# Patient Record
Sex: Female | Born: 1935 | Hispanic: No | Marital: Single
Health system: Southern US, Community
[De-identification: ages and names within clinical notes are randomized; demographics above are authoritative.]

---

## 2005-01-10 ENCOUNTER — Ambulatory Visit: Payer: Self-pay | Admitting: Internal Medicine

## 2006-02-04 ENCOUNTER — Ambulatory Visit: Payer: Self-pay | Admitting: Internal Medicine

## 2007-02-16 ENCOUNTER — Ambulatory Visit: Payer: Self-pay | Admitting: Internal Medicine

## 2008-02-17 ENCOUNTER — Ambulatory Visit: Payer: Self-pay | Admitting: Internal Medicine

## 2009-02-17 ENCOUNTER — Ambulatory Visit: Payer: Self-pay | Admitting: Internal Medicine

## 2010-01-14 ENCOUNTER — Emergency Department: Payer: Self-pay | Admitting: Internal Medicine

## 2010-01-29 ENCOUNTER — Telehealth (INDEPENDENT_AMBULATORY_CARE_PROVIDER_SITE_OTHER): Payer: Self-pay | Admitting: *Deleted

## 2010-02-19 ENCOUNTER — Ambulatory Visit: Payer: Self-pay | Admitting: Internal Medicine

## 2010-05-10 ENCOUNTER — Ambulatory Visit: Payer: Self-pay | Admitting: Ophthalmology

## 2010-05-21 ENCOUNTER — Ambulatory Visit: Payer: Self-pay | Admitting: Ophthalmology

## 2010-10-30 NOTE — Progress Notes (Signed)
Summary: Pt Cx  Phone Note Other Incoming   Summary of Call: Patty from doctor office called,  the appt. made for 6/22 needs to be cancelled, they didn't want to wait that long, no r/s needed.

## 2011-02-22 ENCOUNTER — Ambulatory Visit: Payer: Self-pay | Admitting: Internal Medicine

## 2011-05-17 ENCOUNTER — Ambulatory Visit: Payer: Self-pay | Admitting: Ophthalmology

## 2011-05-17 DIAGNOSIS — I119 Hypertensive heart disease without heart failure: Secondary | ICD-10-CM

## 2011-05-28 ENCOUNTER — Ambulatory Visit: Payer: Self-pay | Admitting: Ophthalmology

## 2012-02-07 ENCOUNTER — Inpatient Hospital Stay: Payer: Self-pay | Admitting: Surgery

## 2012-02-07 LAB — COMPREHENSIVE METABOLIC PANEL
Albumin: 3.8 g/dL (ref 3.4–5.0)
Anion Gap: 10 (ref 7–16)
BUN: 39 mg/dL — ABNORMAL HIGH (ref 7–18)
Chloride: 105 mmol/L (ref 98–107)
Co2: 23 mmol/L (ref 21–32)
EGFR (African American): 27 — ABNORMAL LOW
Osmolality: 287 (ref 275–301)
Potassium: 3.9 mmol/L (ref 3.5–5.1)
Sodium: 138 mmol/L (ref 136–145)
Total Protein: 7.9 g/dL (ref 6.4–8.2)

## 2012-02-07 LAB — CBC
HCT: 24.3 % — ABNORMAL LOW (ref 35.0–47.0)
HGB: 7.4 g/dL — ABNORMAL LOW (ref 12.0–16.0)
MCH: 20.9 pg — ABNORMAL LOW (ref 26.0–34.0)
MCHC: 30.7 g/dL — ABNORMAL LOW (ref 32.0–36.0)
MCV: 68 fL — ABNORMAL LOW (ref 80–100)
RBC: 3.56 10*6/uL — ABNORMAL LOW (ref 3.80–5.20)
RDW: 17.9 % — ABNORMAL HIGH (ref 11.5–14.5)
WBC: 11.8 10*3/uL — ABNORMAL HIGH (ref 3.6–11.0)

## 2012-02-07 LAB — LACTATE DEHYDROGENASE: LDH: 273 U/L — ABNORMAL HIGH (ref 84–246)

## 2012-02-07 LAB — CK TOTAL AND CKMB (NOT AT ARMC)
CK, Total: 74 U/L (ref 21–215)
CK-MB: 2.3 ng/mL (ref 0.5–3.6)

## 2012-02-07 LAB — IRON AND TIBC
Iron Saturation: 3 %
Unbound Iron-Bind.Cap.: 582 ug/dL

## 2012-02-07 LAB — FERRITIN: Ferritin (ARMC): 5 ng/mL — ABNORMAL LOW (ref 8–388)

## 2012-02-08 LAB — URINALYSIS, COMPLETE
Bilirubin,UR: NEGATIVE
Bilirubin,UR: NEGATIVE
Blood: NEGATIVE
Blood: NEGATIVE
Glucose,UR: NEGATIVE mg/dL (ref 0–75)
Glucose,UR: NEGATIVE mg/dL (ref 0–75)
Ketone: NEGATIVE
Leukocyte Esterase: NEGATIVE
Nitrite: NEGATIVE
Ph: 5 (ref 4.5–8.0)
Ph: 6 (ref 4.5–8.0)
Protein: NEGATIVE
RBC,UR: NONE SEEN /HPF (ref 0–5)
RBC,UR: NONE SEEN /HPF (ref 0–5)
Specific Gravity: 1.005 (ref 1.003–1.030)
Squamous Epithelial: 1
Squamous Epithelial: NONE SEEN
Transitional Epi: 1
WBC UR: 55 /HPF (ref 0–5)

## 2012-02-08 LAB — BASIC METABOLIC PANEL
Anion Gap: 7 (ref 7–16)
BUN: 32 mg/dL — ABNORMAL HIGH (ref 7–18)
Calcium, Total: 9.1 mg/dL (ref 8.5–10.1)
Chloride: 106 mmol/L (ref 98–107)
Co2: 26 mmol/L (ref 21–32)
Creatinine: 1.29 mg/dL (ref 0.60–1.30)
EGFR (African American): 46 — ABNORMAL LOW
Glucose: 90 mg/dL (ref 65–99)

## 2012-02-08 LAB — LIPID PANEL
Cholesterol: 188 mg/dL (ref 0–200)
Triglycerides: 144 mg/dL (ref 0–200)
VLDL Cholesterol, Calc: 29 mg/dL (ref 5–40)

## 2012-02-08 LAB — CK TOTAL AND CKMB (NOT AT ARMC): CK-MB: 1.8 ng/mL (ref 0.5–3.6)

## 2012-02-08 LAB — MAGNESIUM: Magnesium: 2 mg/dL

## 2012-02-08 LAB — CBC WITH DIFFERENTIAL/PLATELET
Bands: 1 %
Basophil #: 0.1 10*3/uL (ref 0.0–0.1)
Eosinophil #: 0.2 10*3/uL (ref 0.0–0.7)
Eosinophil %: 1.9 %
Eosinophil: 2 %
Lymphocyte #: 4.3 10*3/uL — ABNORMAL HIGH (ref 1.0–3.6)
Lymphocytes: 29 %
MCH: 21.4 pg — ABNORMAL LOW (ref 26.0–34.0)
MCHC: 31.7 g/dL — ABNORMAL LOW (ref 32.0–36.0)
MCV: 68 fL — ABNORMAL LOW (ref 80–100)
Neutrophil %: 51.5 %
Platelet: 424 10*3/uL (ref 150–440)
RDW: 17.5 % — ABNORMAL HIGH (ref 11.5–14.5)
Segmented Neutrophils: 59 %

## 2012-02-08 LAB — SGOT (AST)(ARMC): SGOT(AST): 34 U/L (ref 15–37)

## 2012-02-08 LAB — TROPONIN I: Troponin-I: <0.02 ng/mL

## 2012-02-09 LAB — BASIC METABOLIC PANEL
Anion Gap: 9 (ref 7–16)
BUN: 19 mg/dL — ABNORMAL HIGH (ref 7–18)
Creatinine: 1.07 mg/dL (ref 0.60–1.30)
EGFR (Non-African Amer.): 49 — ABNORMAL LOW
Glucose: 86 mg/dL (ref 65–99)
Osmolality: 285 (ref 275–301)
Sodium: 142 mmol/L (ref 136–145)

## 2012-02-09 LAB — CBC WITH DIFFERENTIAL/PLATELET
Basophil %: 1.2 %
Eosinophil #: 0.5 10*3/uL (ref 0.0–0.7)
Eosinophil %: 6.1 %
HCT: 33 % — ABNORMAL LOW (ref 35.0–47.0)
HGB: 10.6 g/dL — ABNORMAL LOW (ref 12.0–16.0)
Lymphocyte #: 3.9 10*3/uL — ABNORMAL HIGH (ref 1.0–3.6)
Lymphocyte %: 43.3 %
MCH: 23.6 pg — ABNORMAL LOW (ref 26.0–34.0)
MCHC: 32 g/dL (ref 32.0–36.0)
MCV: 74 fL — ABNORMAL LOW (ref 80–100)
Neutrophil #: 3.5 10*3/uL (ref 1.4–6.5)
Neutrophil %: 39.2 %
Platelet: 382 10*3/uL (ref 150–440)
RBC: 4.48 10*6/uL (ref 3.80–5.20)
RDW: 22.9 % — ABNORMAL HIGH (ref 11.5–14.5)
WBC: 8.9 10*3/uL (ref 3.6–11.0)

## 2012-02-10 LAB — CBC WITH DIFFERENTIAL/PLATELET
HCT: 34.4 % — ABNORMAL LOW (ref 35.0–47.0)
Lymphocyte %: 38.9 %
MCV: 74 fL — ABNORMAL LOW (ref 80–100)
Monocyte #: 1 x10 3/mm — ABNORMAL HIGH (ref 0.2–0.9)
Monocyte %: 10.9 %
Neutrophil #: 3.8 10*3/uL (ref 1.4–6.5)
Platelet: 379 10*3/uL (ref 150–440)
RDW: 23.3 % — ABNORMAL HIGH (ref 11.5–14.5)
WBC: 9.3 10*3/uL (ref 3.6–11.0)

## 2012-02-11 LAB — CBC WITH DIFFERENTIAL/PLATELET
Basophil #: 0.1 10*3/uL (ref 0.0–0.1)
HCT: 34.1 % — ABNORMAL LOW (ref 35.0–47.0)
HGB: 10.9 g/dL — ABNORMAL LOW (ref 12.0–16.0)
Lymphocyte #: 3.4 10*3/uL (ref 1.0–3.6)
Lymphocyte %: 36.1 %
MCV: 74 fL — ABNORMAL LOW (ref 80–100)
Monocyte #: 0.9 x10 3/mm (ref 0.2–0.9)
Monocyte %: 9.2 %
Neutrophil #: 4.6 10*3/uL (ref 1.4–6.5)
Platelet: 369 10*3/uL (ref 150–440)
RDW: 23.2 % — ABNORMAL HIGH (ref 11.5–14.5)

## 2012-02-12 LAB — CBC WITH DIFFERENTIAL/PLATELET
Basophil #: 0.2 10*3/uL — ABNORMAL HIGH (ref 0.0–0.1)
HCT: 32.4 % — ABNORMAL LOW (ref 35.0–47.0)
HGB: 10.3 g/dL — ABNORMAL LOW (ref 12.0–16.0)
Lymphocyte #: 3.3 10*3/uL (ref 1.0–3.6)
Lymphocyte %: 38.9 %
MCHC: 31.9 g/dL — ABNORMAL LOW (ref 32.0–36.0)
Monocyte #: 0.9 x10 3/mm (ref 0.2–0.9)
Neutrophil %: 42.1 %
Platelet: 355 10*3/uL (ref 150–440)
RBC: 4.37 10*6/uL (ref 3.80–5.20)
RDW: 23 % — ABNORMAL HIGH (ref 11.5–14.5)
WBC: 8.4 10*3/uL (ref 3.6–11.0)

## 2012-02-12 LAB — PATHOLOGY REPORT

## 2012-02-13 LAB — BASIC METABOLIC PANEL
Anion Gap: 7 (ref 7–16)
Calcium, Total: 8.1 mg/dL — ABNORMAL LOW (ref 8.5–10.1)
Creatinine: 0.91 mg/dL (ref 0.60–1.30)
EGFR (African American): >60
Glucose: 123 mg/dL — ABNORMAL HIGH (ref 65–99)
Osmolality: 274 (ref 275–301)
Potassium: 3.9 mmol/L (ref 3.5–5.1)
Sodium: 137 mmol/L (ref 136–145)

## 2012-02-13 LAB — CBC WITH DIFFERENTIAL/PLATELET
Eosinophil #: 0 10*3/uL (ref 0.0–0.7)
Eosinophil %: 0.2 %
HCT: 31.3 % — ABNORMAL LOW (ref 35.0–47.0)
HGB: 9.5 g/dL — ABNORMAL LOW (ref 12.0–16.0)
Lymphocyte #: 1.5 10*3/uL (ref 1.0–3.6)
MCHC: 30.5 g/dL — ABNORMAL LOW (ref 32.0–36.0)
Monocyte #: 1 x10 3/mm — ABNORMAL HIGH (ref 0.2–0.9)
Monocyte %: 6.9 %
Neutrophil #: 11.9 10*3/uL — ABNORMAL HIGH (ref 1.4–6.5)
Neutrophil %: 82 %
WBC: 14.4 10*3/uL — ABNORMAL HIGH (ref 3.6–11.0)

## 2012-02-14 LAB — CBC WITH DIFFERENTIAL/PLATELET
Basophil #: 0.1 10*3/uL (ref 0.0–0.1)
Basophil %: 0.6 %
Eosinophil #: 0.4 10*3/uL (ref 0.0–0.7)
Eosinophil %: 3 %
HGB: 9.2 g/dL — ABNORMAL LOW (ref 12.0–16.0)
Monocyte #: 1.2 x10 3/mm — ABNORMAL HIGH (ref 0.2–0.9)
Monocyte %: 9.5 %
WBC: 12.6 10*3/uL — ABNORMAL HIGH (ref 3.6–11.0)

## 2012-02-14 LAB — BASIC METABOLIC PANEL
BUN: 6 mg/dL — ABNORMAL LOW (ref 7–18)
Chloride: 105 mmol/L (ref 98–107)
EGFR (African American): >60
Glucose: 108 mg/dL — ABNORMAL HIGH (ref 65–99)
Osmolality: 272 (ref 275–301)
Potassium: 4 mmol/L (ref 3.5–5.1)
Sodium: 137 mmol/L (ref 136–145)

## 2012-02-18 LAB — BASIC METABOLIC PANEL
Anion Gap: 8 (ref 7–16)
BUN: 11 mg/dL (ref 7–18)
Calcium, Total: 8.6 mg/dL (ref 8.5–10.1)
Chloride: 106 mmol/L (ref 98–107)
Creatinine: 1.14 mg/dL (ref 0.60–1.30)
EGFR (African American): 53 — ABNORMAL LOW
EGFR (Non-African Amer.): 46 — ABNORMAL LOW
Osmolality: 277 (ref 275–301)
Potassium: 4.1 mmol/L (ref 3.5–5.1)

## 2012-02-18 LAB — CBC WITH DIFFERENTIAL/PLATELET
Eosinophil #: 0.3 10*3/uL (ref 0.0–0.7)
Eosinophil %: 2.3 %
HCT: 31.1 % — ABNORMAL LOW (ref 35.0–47.0)
Lymphocyte %: 17.1 %
MCH: 23.9 pg — ABNORMAL LOW (ref 26.0–34.0)
Monocyte #: 1.2 x10 3/mm — ABNORMAL HIGH (ref 0.2–0.9)
Neutrophil #: 8.5 10*3/uL — ABNORMAL HIGH (ref 1.4–6.5)
Neutrophil %: 70.2 %
Platelet: 445 10*3/uL — ABNORMAL HIGH (ref 150–440)
RBC: 4.23 10*6/uL (ref 3.80–5.20)
WBC: 12.1 10*3/uL — ABNORMAL HIGH (ref 3.6–11.0)

## 2012-02-22 LAB — CBC WITH DIFFERENTIAL/PLATELET
Basophil #: 0.1 10*3/uL (ref 0.0–0.1)
Basophil %: 1 %
Eosinophil #: 0.4 10*3/uL (ref 0.0–0.7)
HCT: 33.7 % — ABNORMAL LOW (ref 35.0–47.0)
HGB: 10.3 g/dL — ABNORMAL LOW (ref 12.0–16.0)
Lymphocyte #: 3.4 10*3/uL (ref 1.0–3.6)
Lymphocyte %: 26.3 %
MCV: 75 fL — ABNORMAL LOW (ref 80–100)
Monocyte #: 1.4 x10 3/mm — ABNORMAL HIGH (ref 0.2–0.9)
Neutrophil #: 7.6 10*3/uL — ABNORMAL HIGH (ref 1.4–6.5)
Platelet: 580 10*3/uL — ABNORMAL HIGH (ref 150–440)
RBC: 4.48 10*6/uL (ref 3.80–5.20)

## 2012-02-22 LAB — BASIC METABOLIC PANEL
BUN: 8 mg/dL (ref 7–18)
Calcium, Total: 9.1 mg/dL (ref 8.5–10.1)
Chloride: 103 mmol/L (ref 98–107)
Creatinine: 1.05 mg/dL (ref 0.60–1.30)
Osmolality: 276 (ref 275–301)
Potassium: 3.8 mmol/L (ref 3.5–5.1)
Sodium: 139 mmol/L (ref 136–145)

## 2012-02-23 LAB — BASIC METABOLIC PANEL
Anion Gap: 10 (ref 7–16)
BUN: 7 mg/dL (ref 7–18)
Calcium, Total: 8.6 mg/dL (ref 8.5–10.1)
Chloride: 106 mmol/L (ref 98–107)
Creatinine: 1.17 mg/dL (ref 0.60–1.30)
EGFR (African American): 51 — ABNORMAL LOW
EGFR (Non-African Amer.): 44 — ABNORMAL LOW
Glucose: 92 mg/dL (ref 65–99)
Osmolality: 275 (ref 275–301)
Potassium: 4.1 mmol/L (ref 3.5–5.1)
Sodium: 139 mmol/L (ref 136–145)

## 2012-02-23 LAB — CBC WITH DIFFERENTIAL/PLATELET
Basophil #: 0.1 10*3/uL (ref 0.0–0.1)
Eosinophil #: 0.6 10*3/uL (ref 0.0–0.7)
HCT: 32.2 % — ABNORMAL LOW (ref 35.0–47.0)
Lymphocyte #: 2.7 10*3/uL (ref 1.0–3.6)
Lymphocyte %: 21.4 %
MCH: 23.8 pg — ABNORMAL LOW (ref 26.0–34.0)
Monocyte #: 1.2 x10 3/mm — ABNORMAL HIGH (ref 0.2–0.9)
Monocyte %: 9.5 %
Neutrophil #: 8.2 10*3/uL — ABNORMAL HIGH (ref 1.4–6.5)
Platelet: 569 10*3/uL — ABNORMAL HIGH (ref 150–440)
RBC: 4.27 10*6/uL (ref 3.80–5.20)
WBC: 12.8 10*3/uL — ABNORMAL HIGH (ref 3.6–11.0)

## 2012-02-26 LAB — BASIC METABOLIC PANEL
Chloride: 107 mmol/L (ref 98–107)
Co2: 26 mmol/L (ref 21–32)
Creatinine: 1.1 mg/dL (ref 0.60–1.30)
EGFR (Non-African Amer.): 48 — ABNORMAL LOW
Osmolality: 281 (ref 275–301)
Sodium: 142 mmol/L (ref 136–145)

## 2012-02-26 LAB — CBC WITH DIFFERENTIAL/PLATELET
Basophil %: 1 %
Eosinophil #: 0.5 10*3/uL (ref 0.0–0.7)
Eosinophil %: 4.6 %
HCT: 30.1 % — ABNORMAL LOW (ref 35.0–47.0)
MCH: 24 pg — ABNORMAL LOW (ref 26.0–34.0)
MCHC: 31.7 g/dL — ABNORMAL LOW (ref 32.0–36.0)
MCV: 76 fL — ABNORMAL LOW (ref 80–100)
Monocyte #: 1.1 x10 3/mm — ABNORMAL HIGH (ref 0.2–0.9)
Monocyte %: 10.1 %
Neutrophil #: 6.8 10*3/uL — ABNORMAL HIGH (ref 1.4–6.5)
Neutrophil %: 62.8 %
Platelet: 554 10*3/uL — ABNORMAL HIGH (ref 150–440)
WBC: 10.8 10*3/uL (ref 3.6–11.0)

## 2012-03-24 ENCOUNTER — Ambulatory Visit: Payer: Self-pay | Admitting: Internal Medicine

## 2012-04-06 ENCOUNTER — Ambulatory Visit: Payer: Self-pay | Admitting: Oncology

## 2012-04-20 ENCOUNTER — Ambulatory Visit: Payer: Self-pay | Admitting: Oncology

## 2012-04-30 ENCOUNTER — Ambulatory Visit: Payer: Self-pay | Admitting: Oncology

## 2012-05-30 ENCOUNTER — Emergency Department: Payer: Self-pay | Admitting: Emergency Medicine

## 2012-12-29 IMAGING — CR DG ABDOMEN 2V
1 series · 2 of 2 positions shown · non-contrast
Comparison: none

REASON FOR EXAM: Ileus
COMMENTS:

[Series 1: erect pa · 0.17mm/px · 2 of 2 slices shown]
[im 1/2]
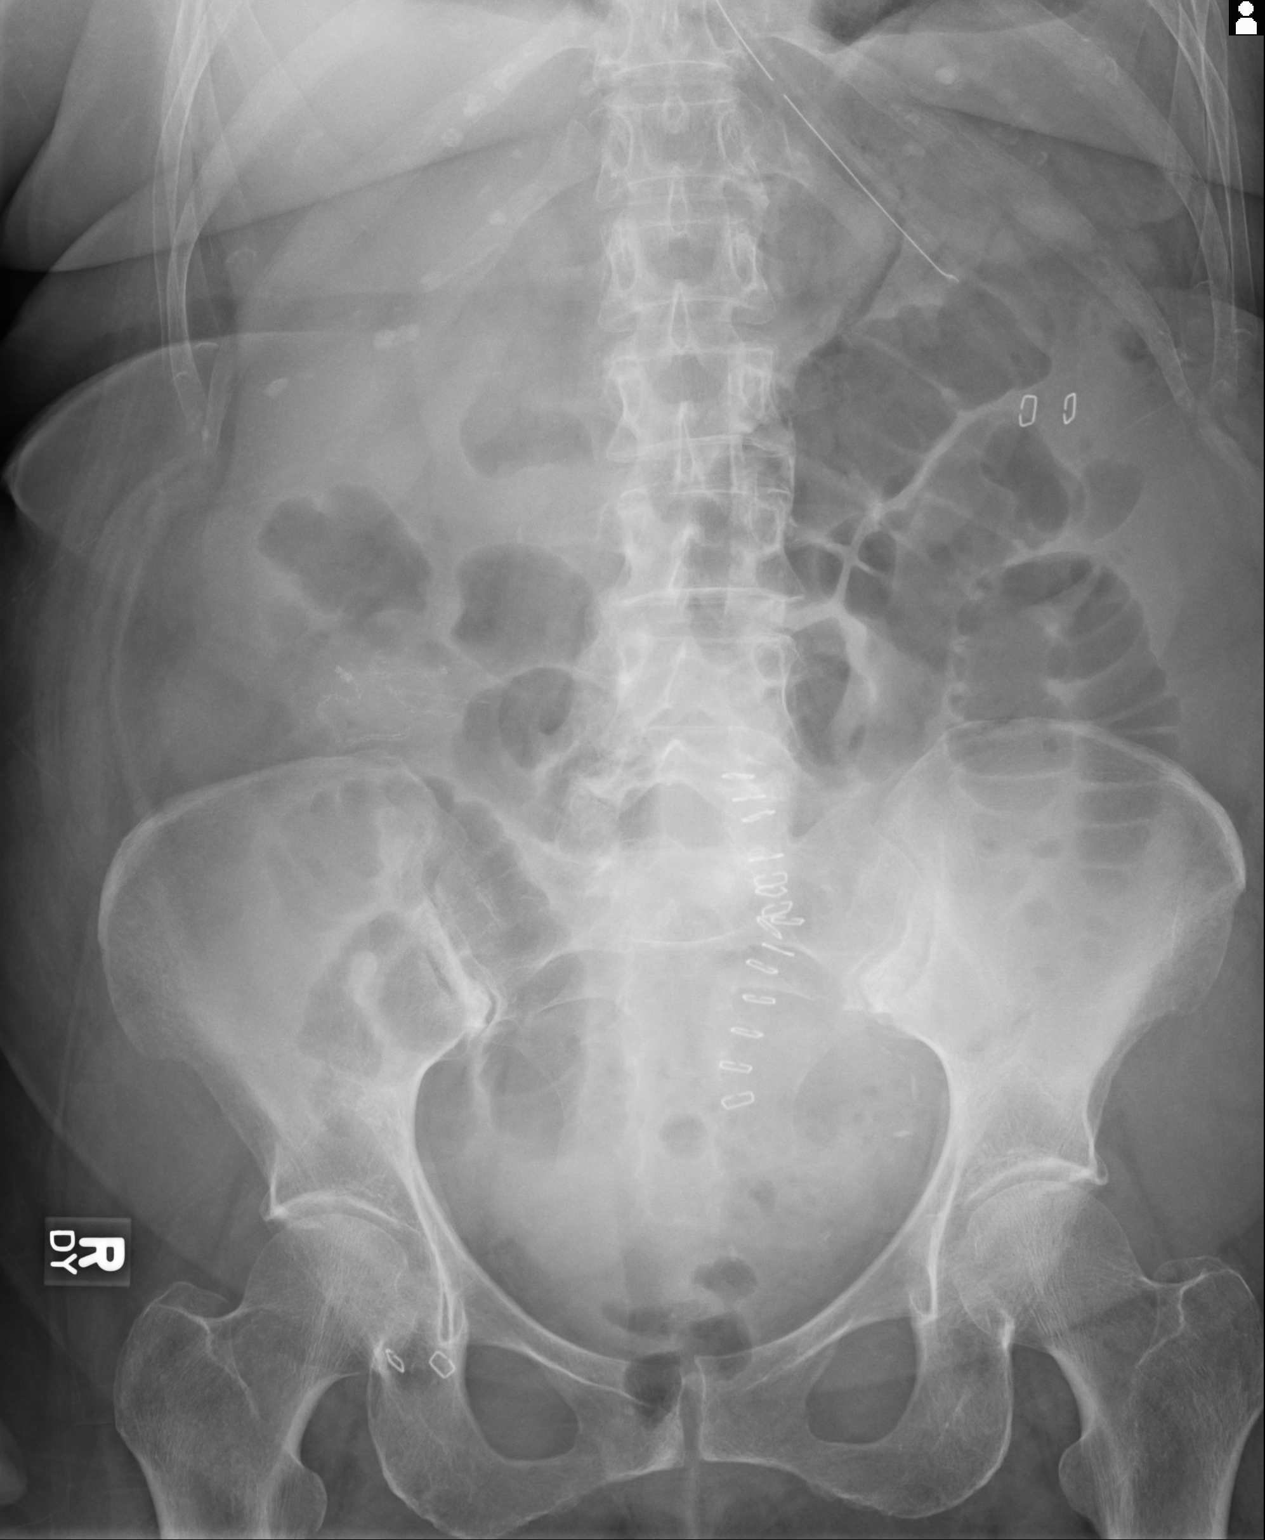
[im 2/2]
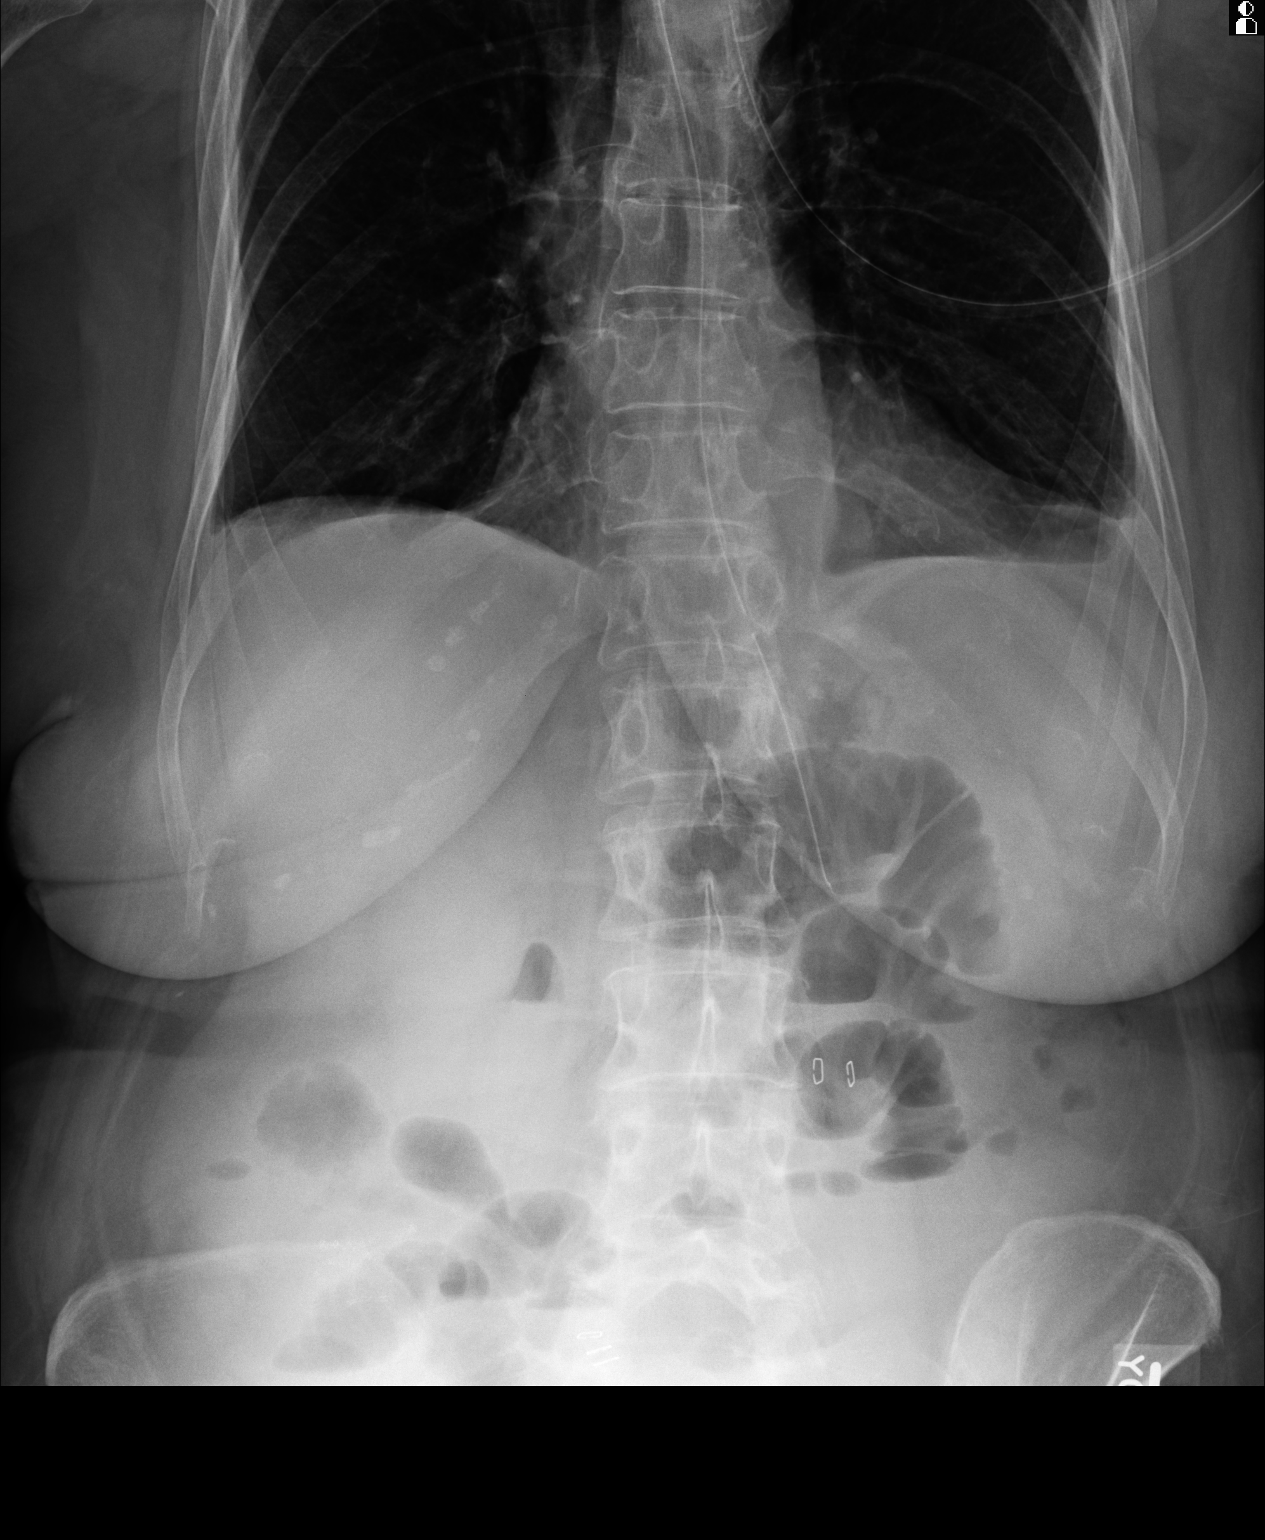

[2 of 2 positions shown; findings below may reference images not displayed]

PROCEDURE:     DXR - DXR ABDOMEN 2 V FLAT AND ERECT  - February 22, 2012  [DATE]

RESULT:     This study was compared to previous study dated 02/21/2012.

Air-filled loops of small bowel are once again appreciated. There does
appear to be small foci of air within loops of distal large bowel. When
compared to the previous study the distended small bowel loops have
decreased in conspicuity. There also appears to be persistent air-fluid
levels. An NG tube has been placed in the interim and these findings like
reflect a component of nasogastric decompression. The small amount of air
within the distal portion of large bowel also likely reflects a component of
resolution of an ileus or partial small bowel obstruction.  Continued
surveillance evaluation is recommended to assure resolution and/or
progression.
IMPRESSION: 1. Findings which appear to demonstrates slight improvement of the patient's
ileus versus partial or early small bowel obstruction a component of which
as described above is likely due to nasogastric tube decompression though
there does appear to be interval development of a small amount of distal
bowel gas suggesting a component of peristalsis in the bowel and possible
component of resolution. Surveillance evaluation is recommended.

## 2013-01-01 IMAGING — CT CT ABD-PELV W/ CM
1 of 3 series · 13 of 32 positions shown, 18 images · non-contrast
Comparison: none

REASON FOR EXAM: (1) SBO; (2) SBO
COMMENTS:

PROCEDURE:     CT  - CT ABDOMEN / PELVIS  W  - February 25, 2012 [DATE]
RESULT:
TECHNIQUE: Helical 3 mm sections were obtained from the lung bases through
the pubic symphysis status post intravenous administration of 85 ml of
Bsovue-KZK and oral contrast.

[Series 2: soft tissue · axial · 0.62mm/px · z∈[-478,-102]mm · 13 of 139 slices shown, 18 images]
[im 7/139  soft-tissue]
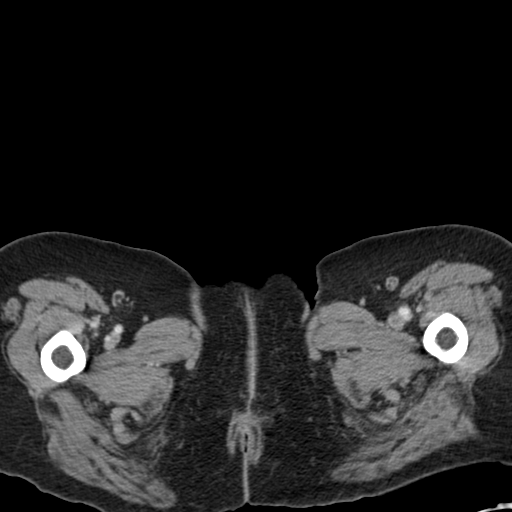
[im 7/139  bone]
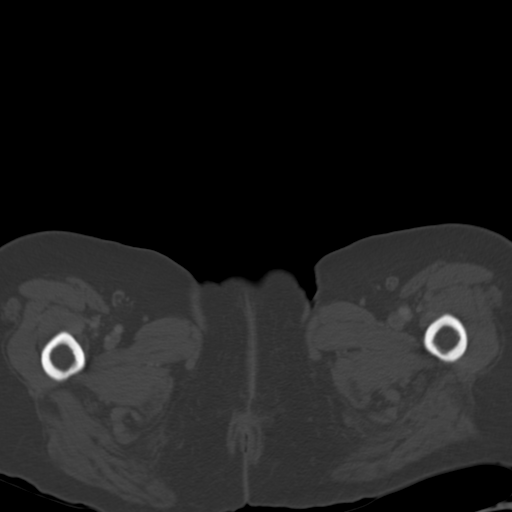
[im 21/139  soft-tissue]
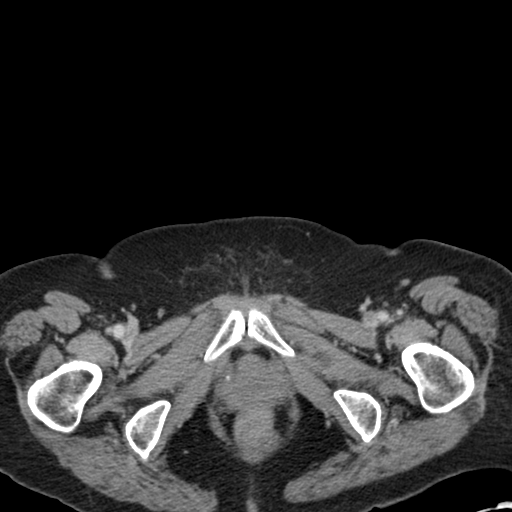
[im 28/139  soft-tissue]
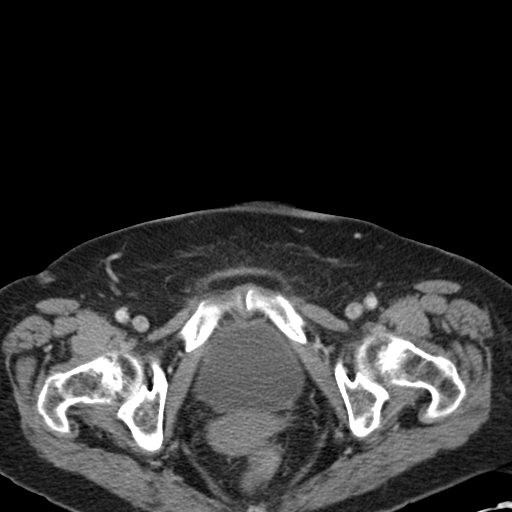
[im 42/139  soft-tissue]
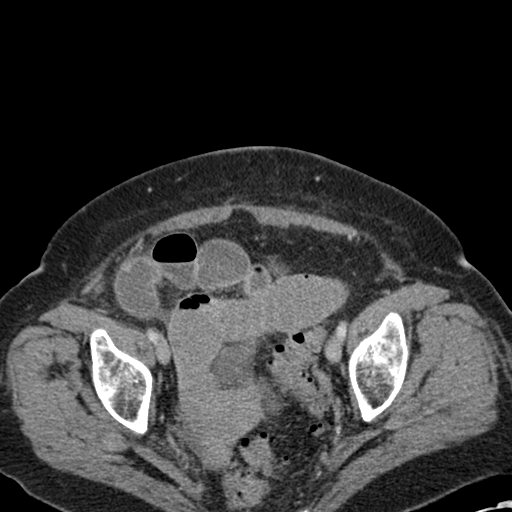
[im 56/139  soft-tissue]
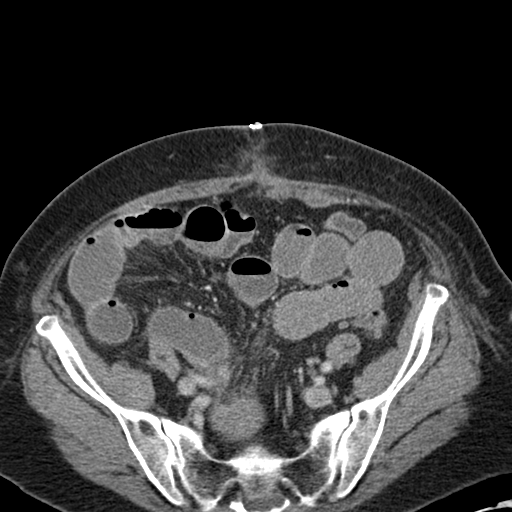
[im 63/139  soft-tissue]
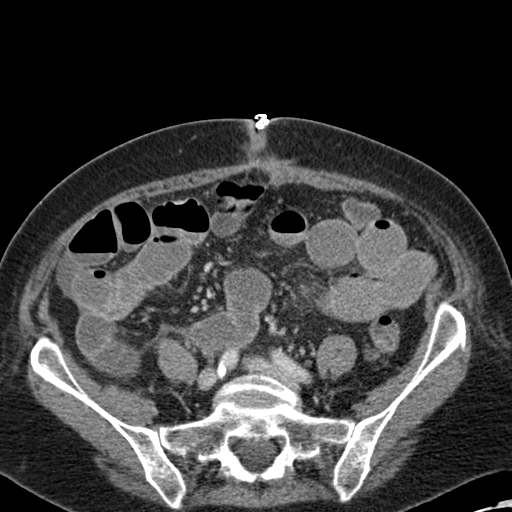
[im 76/139  soft-tissue]
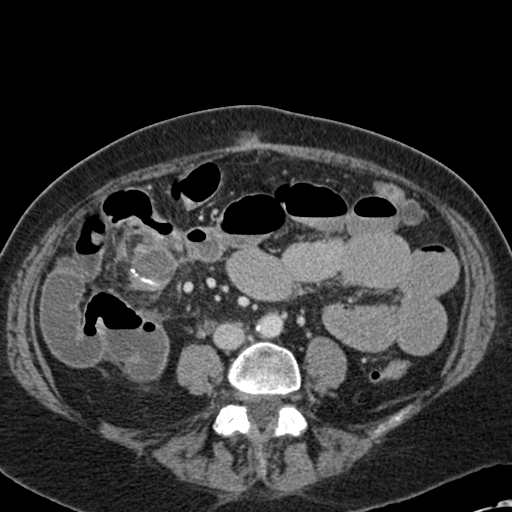
[im 83/139  soft-tissue]
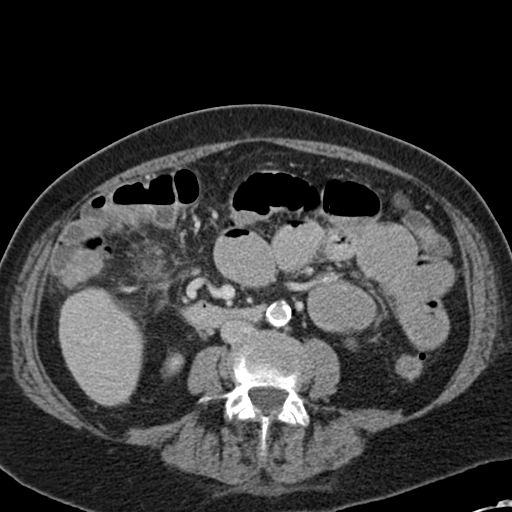
[im 97/139  soft-tissue]
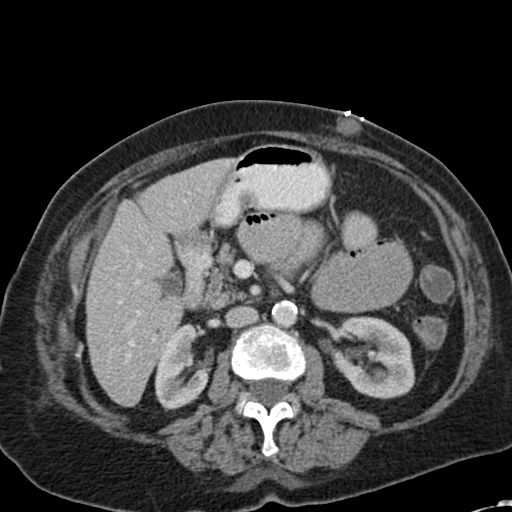
[im 97/139  bone]
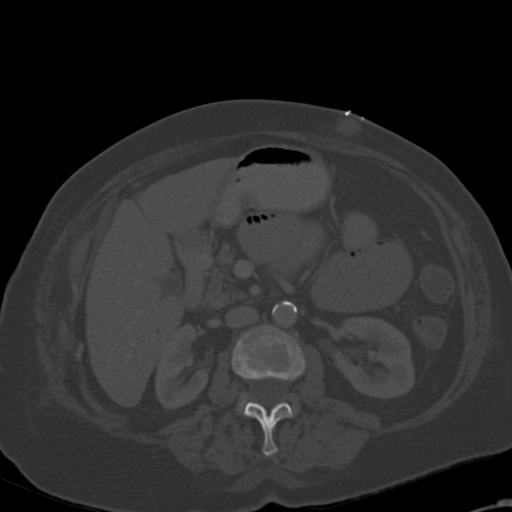
[im 111/139  soft-tissue]
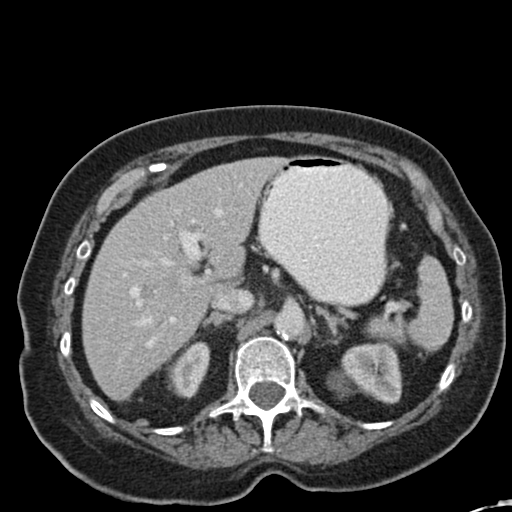
[im 111/139  lung]
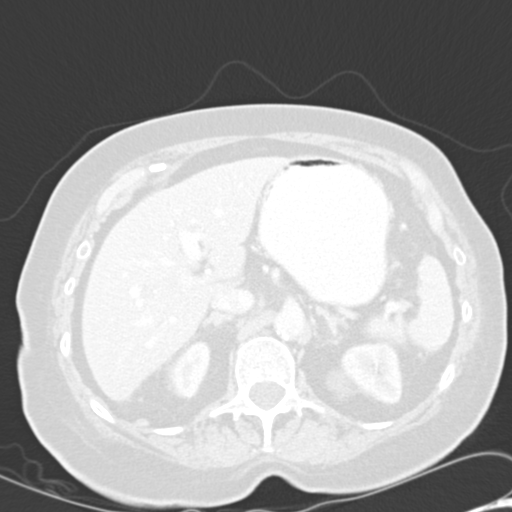
[im 118/139  soft-tissue]
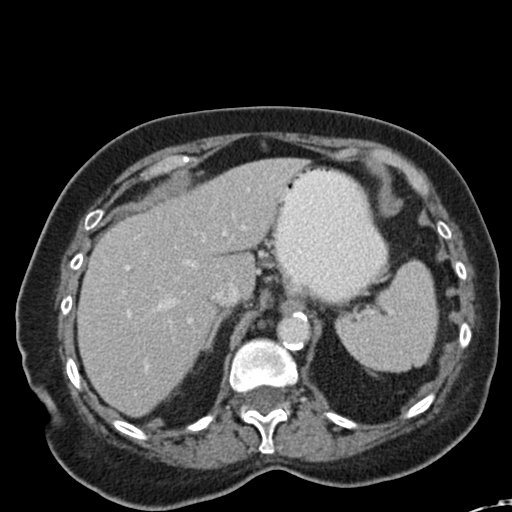
[im 118/139  lung]
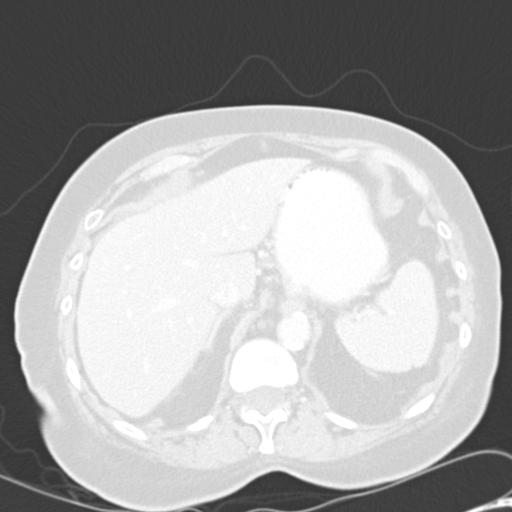
[im 125/139  lung]
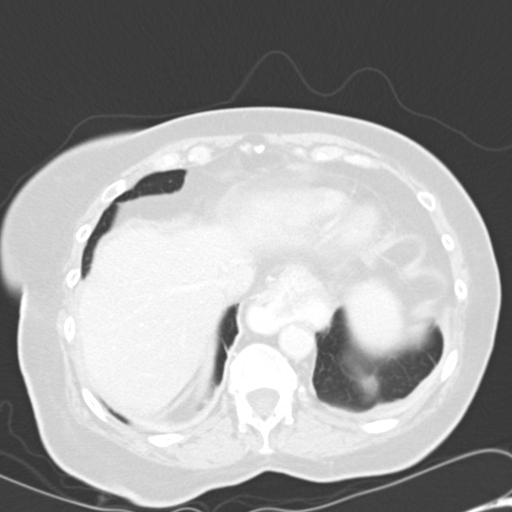
[im 132/139  soft-tissue]
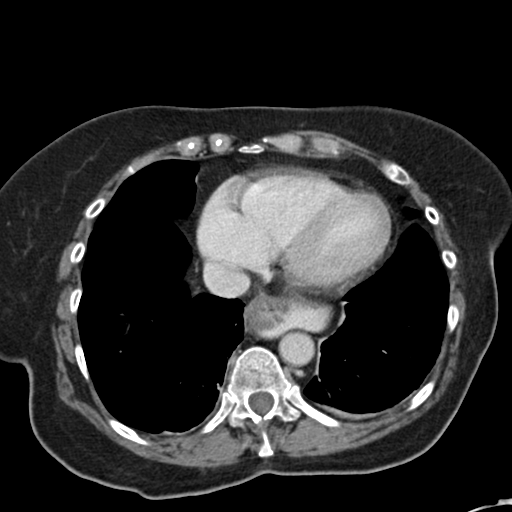
[im 132/139  lung]
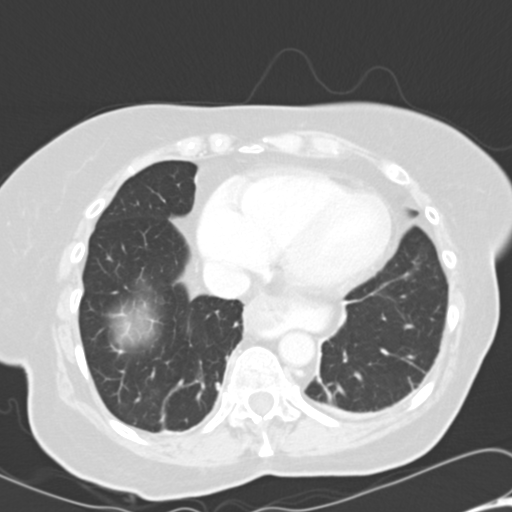

[13 of 32 positions shown; findings below may reference images not displayed]

FINDINGS: Evaluation of the lung bases demonstrates trace pleural
thickening versus trace effusions. A moderate size hiatal hernia is
identified.

The liver, spleen, adrenals, pancreas, and right kidney are unremarkable. A
lobulated, low attenuating, nonenhancing focus is identified along the
posterior midpole region of the left kidney measuring approximately 2.64 x
2.92 cm. This area demonstrates Hounsfield units of 17 and 23 on immediate
and delayed imaging. There does not appear to be appreciable enhancement
though the Hounsfield units are not consistent with simple fluid. This
finding likely represents a proteinaceous or possibly partially hemorrhagic
cyst. The area has a lobulated component and two adjacent cysts versus one
single lobulated cyst are of diagnostic consideration. There is no evidence
of hydronephrosis, hydroureter or nephrolithiasis.

Multiple dilated loops of small bowel are appreciated containing dilute
contrast. Loops are appreciated within the abdomen and pelvis. There appears
to be a transition point within the right lower quadrant in a region of
prior surgical intervention. The patient has a reported history of prior
bowel resection and this finding has the appearance of a region of
anastomosis. This area communicates with the colon which proximally
demonstrates partially air-filled loops and distally is decompressed. No
drainable loculated fluid collections are identified. There is stranding
within the surrounding mesenteric fat which may represent post surgical
change. Inflammatory changes, possibly infectious cannot be excluded. A
trace amount of fluid is appreciated within this region.

Severe diverticulosis is appreciated within the sigmoid colon without
evidence of diverticulitis. There is no evidence of an abdominal aortic
aneurysm. The celiac, SMA, IMA, portal vein, and SMV are opacified.
IMPRESSION: 1.  Findings consistent with a small bowel obstruction. The transition point
appears to be at the level of the patient's colonic small bowel anastomosis.
No drainable loculated fluid collections are identified though there is
stranding within the surrounding mesenteric fat which may represent
inflammatory changes or post surgical changes. Surveillance evaluation is
recommended as well as clinical correlation.
2.  Likely cyst involving the left kidney. This finding too is indeterminate
and surveillance is recommended.
3.  Hiatal hernia.
4.  Diverticulosis within the sigmoid colon without evidence of
diverticulitis.

[REDACTED]

## 2013-03-25 ENCOUNTER — Ambulatory Visit: Payer: Self-pay | Admitting: Internal Medicine

## 2013-03-30 ENCOUNTER — Ambulatory Visit: Payer: Self-pay | Admitting: Oncology

## 2013-04-05 ENCOUNTER — Ambulatory Visit: Payer: Self-pay | Admitting: Oncology

## 2013-04-21 ENCOUNTER — Ambulatory Visit: Payer: Self-pay | Admitting: Unknown Physician Specialty

## 2013-04-30 ENCOUNTER — Ambulatory Visit: Payer: Self-pay | Admitting: Oncology

## 2014-04-11 ENCOUNTER — Ambulatory Visit: Payer: Self-pay | Admitting: Internal Medicine

## 2015-01-22 NOTE — Discharge Summary (Signed)
PATIENT NAME:  Grace English, Grace English MR#:  [id number] DATE OF BIRTH:  1930/08/26  DATE OF ADMISSION:  02/07/2012 DATE OF DISCHARGE:  02/28/2012  BRIEF HISTORY: Ms. Elsworth Sohonn Belsky is a 81 year old woman admitted to the hospital on 05/10 to the internal medicine service through the Emergency Room. She had a recent episode of syncope and transient atrial fibrillation and developed two recent syncopal episodes. She had presenting blood pressure in the low 90s. Hemoglobin was 7.4. Work-up was undertaken. Gastroenterology consultation was obtained. Upper gastrointestinal endoscopy was performed on the thirteenth which did not demonstrate any significant abnormalities. She had been transfused 2 units of blood. Colonoscopy was performed on the fourteenth, which demonstrated a sessile, ulcerating, nonobstructive mass in the mid ascending colon, which felt to likely be responsible for her ongoing anemia. The surgical service was consulted and we saw her that afternoon. We elected to proceed with surgical intervention. She was taken to surgery on the afternoon of 02/12/2012 where she underwent a laparoscopic-assisted right colectomy. Postoperative course was initially unremarkable. She did well and was tentatively planned for discharge on 05/24. However, that afternoon she began to have significant nausea and vomiting and obviously had a pronounced ileus. Nasogastric suction was reinstituted. Over the next several days her bowel was decompressed. She began to regain bowel function by the 29th and advanced to a liquid diet by the 30th and discharged home with a soft diet by the 31st. Postoperative evaluation did not reveal any significant post procedural problems including a postoperative CT scan. She was given Vicodin for pain.   DISCHARGE MEDICATIONS:  1. Biotin 1,000 mcg p.o. daily.  2. Calcium carbonate 250 mg, 3 tablets once a day.  3. Hyzaar 100 mg/25 mg once a day.  4. Lovastatin 20 mg once a day.  5. Niacin 500 mg once  a day.  6. Phenergan 25 mg every four hours p.r.n.  7. Synthroid 50 mcg p.o. daily.  8. Verapamil 180 mg p.o. daily. 9. Vitamin D 2000 international units p.o. daily.   FINAL DISCHARGE DIAGNOSIS: Invasive adenocarcinoma of the colon, initial lesion T1.   SURGERY:  Laparoscopically-assisted right colectomy.   ____________________________ Carmie Endalph L. Ely III, MD rle:bjt D: 03/08/2012 07:14:37 ET T: 03/09/2012 13:16:21 ET JOB#: [id number]  cc: Carmie Endalph L. Ely III, MD, <Dictator> Marya AmslerMarshall W. Dareen PianoAnderson, MD Scot Junobert T. Elliott, MD Quentin OreALPH L ELY MD ELECTRONICALLY SIGNED 03/10/2012 8:38

## 2015-01-22 NOTE — Consult Note (Signed)
PATIENT NAME:  Grace English, Grace English MR#:  [id number] DATE OF BIRTH:  02-Jun-1930  DATE OF CONSULTATION:  02/11/2012  REFERRING PHYSICIAN:   CONSULTING PHYSICIAN:  Carmie Endalph L. Ely III, MD  PRIMARY CARE PHYSICIAN: Dr. Einar CrowMarshall Anderson  GASTROENTEROLOGIST: Dr. Lynnae Prudeobert Elliott   REASON FOR CONSULTATION: Possible colon carcinoma.   BRIEF HISTORY: Grace English is a 81 year old woman presenting to the Emergency Room with a syncopal episode and anemia. She has multiple medical problems, including multiple episodes of syncope with transient atrial fibrillation. Her symptoms began approximately a year and a half ago. Work-up has demonstrated hemoglobin of 7.4. She has no evidence of any cardiac disability at the present time. There was no sign of any atrial fibrillation during her work-up. She had a colonoscopy in 2005. She had multiple polyps at that time and multiple diverticula. With her low hemoglobin she underwent an upper gastrointestinal endoscopy yesterday which was unremarkable and colonoscopy today which demonstrated a 4 cm right-sided lesion which was likely a cancer. Biopsies were taken. The lesion was felt to be in need of resection. The surgical service was consulted.   PAST MEDICAL HISTORY: 1. Hypertension. 2. Hyperlipidemia. 3. Followed by Dr. Einar CrowMarshall Anderson and Dr. Harold HedgeKenneth Fath.  4. History of possible mitral valve prolapse. 5. Did have a previous episode of gastric ulceration.   PAST SURGICAL HISTORY:  1. Tonsillectomy. 2. Breast biopsy. 3. Appendectomy.   PHYSICAL EXAMINATION:  GENERAL: She is an alert, pleasant woman in no significant distress.   VITAL SIGNS: Her vital signs are stable and she is afebrile.   HEENT: No scleral icterus. She has equally round pupils and no scleral icterus.   NECK: Supple without adenopathy. Trachea is midline. I cannot palpate her thyroid gland.   PULMONARY: No wheezes or adventitious sounds. She has normal pulmonary excursion.   CARDIAC: No murmurs  or gallops to my ear and she does not appear to have an irregular rhythm.   ABDOMEN: Soft with no abdominal tenderness. She has no point tenderness or rebound. No guarding. She has a small hernia left of midline which may come from the umbilicus initially. She is no inguinal hernias.   EXTREMITIES: She has no lower extremity findings with normal range of motion and no evidence of any deformity.   PSYCHIATRIC: Normal orientation and normal affect.   ASSESSMENT AND RECOMMENDATIONS: This woman appears to present with a lesion in her right colon. This lesion is likely responsible for her anemia. It is likely malignant. In any event it will require resection. She has had her prep for her colonoscopy and we will continue that prep with clear liquids tonight and plan for a laparoscopic-assisted resection tomorrow. This plan has been outlined to her in detail and she is in agreement. Risks, benefits, and options have been outlined and accepted. Her daughter-in-law is a Advertising copy[employer] nurse and is aware of the implications of surgery and the possibility of complication and colostomy.   ____________________________ Carmie Endalph L. Ely III, MD rle:cms D: 02/11/2012 18:55:46 ET T: 02/12/2012 07:34:29 ET JOB#: [id number]  cc: Carmie Endalph L. Ely III, MD, <Dictator> Darlin PriestlyKenneth A. Lady GaryFath, MD Scot Junobert T. Elliott, MD Marya AmslerMarshall W. Dareen PianoAnderson, MD  Quentin OreALPH L ELY MD ELECTRONICALLY SIGNED 02/12/2012 16:40

## 2015-01-22 NOTE — Consult Note (Signed)
Brief Consult Note: Diagnosis: Iron deficiency anemia.  Symptomatic with associated syncopal episodes.  History of Atrial fibrillation, hypothryoidism, Hypertension and gastric ulcer 1983. Personal history of colonic polyps as well as family history of colonic polyps involving her mother.   Consult note dictated.   Recommend to proceed with surgery or procedure.   Discussed with Attending MD.   Comments: Patient's presentation discussed with Dr. Lynnae Prudeobert Elliott.  Following recommendations made.  In agrement with blood transfusion.  Recommend serial monitoring of H/H.  PPI therapy.  Electronic Signatures: Rodman KeyHarrison, Lorynn Moeser S (NP)  (Signed 11-May-13 11:44)  Authored: Brief Consult Note   Last Updated: 11-May-13 11:44 by Rodman KeyHarrison, Camia Dipinto S (NP)

## 2015-01-22 NOTE — Consult Note (Signed)
PATIENT NAME:  Grace English, Orianna E MR#:  [id number] DATE OF BIRTH:  May 13, 1930  DATE OF CONSULTATION:  02/08/2012  REFERRING PHYSICIAN:   CONSULTING PHYSICIAN:  Rodman Keyawn S. Harrison, NP  ADDENDUM:  CURRENT MEDICATIONS: The patient is supposed to be taking at home: 1. Niacin 50 mg 1 tablet a day.  2. Lovastatin 20 mg 1 tablet daily.  3. Synthroid 50 mcg once a day.  4. Losartan/hydrochlorothiazide 1 tablet daily.  5. Biotin 1000 mcg 1 tablet daily.  6. Vitamin D 2000 international units, 3 capsules daily.  7. Verapamil HC ER 108 mg, 2 tablets daily.  8. Calcium, magnesium, and zinc 3 tablets daily.  9. Multivitamin. 10. Occasional B complex.   ALLERGIES: Darvon and penicillin.  The patient's presentation was discussed with Dr. Lynnae Prudeobert Elliott. Recommendation is for the patient to be on mechanical soft diet. Continue with PPI therapy and serial monitoring of hemoglobin and hematocrit as well as hemodynamic status.  Goal is to proceed with an EGD Monday and  if blood loss source is not noted we will proceed with a colonoscopy on Tuesday under the care of Dr. Lynnae Prudeobert Elliott. These recommendations were discussed with the patient by Dr. Mechele CollinElliott.    These services provided by Owens Sharkawn Harrison, NP in collaborative agreement with Dr. Lynnae Prudeobert Elliott.  ____________________________ Rodman Keyawn S. Harrison, NP dsh:bjt D: 02/08/2012 13:29:06 ET T: 02/09/2012 15:18:54 ET JOB#: [id number]  DAWN S HARRISON MD ELECTRONICALLY SIGNED 02/13/2012 12:26

## 2015-01-22 NOTE — Consult Note (Signed)
WBC 8.9, hgb 10/33, plt 382, met B ok.  She had a mild allergic reaction to transfusion, doing well now.  Uneventful night.  Plan EGD tomorrow and possible colonoscopy Tuesday.  I explained to her and son that given possiblity of an ulcer in upper GI tract I did not feel comfortable giving a Golytely prep and therefore could not do a double tomorrow.  They understand now.  Electronic Signatures: Elliott, Robert T (MD)  (Signed on 12-May-13 10:48)  Authored  Last Updated: 12-May-13 10:48 by Elliott, Robert T (MD)  

## 2015-01-22 NOTE — Consult Note (Signed)
PATIENT NAME:  Grace English, Grace English MR#:  [id number] DATE OF BIRTH:  [birth year]  DATE OF CONSULTATION:  02/08/2012  REFERRING PHYSICIAN:   CONSULTING PHYSICIAN:  Dr. Junita Push, NP for [place] CARE PHYSICIAN:  Dr. Frazier Richards PRIMARY CARDIOLOGIST:  Dr. Bartholome Bill   REASON FOR CONSULT:  Iron deficiency anemia.  HISTORY OF PRESENT ILLNESS: Grace English is a 81 year old Caucasian female who presented to [facility] Emergency Room for the concern of syncopal episodes. She has significant past medical history of vitamin D deficiency, osteoporosis, hyperlipidemia, hypertension, hypothyroidism, history of gastric ulcer in 1983, history of recurrent syncopal episodes, and transient atrial fibrillation. In reviewing history of syncopal episodes the patient states the onset was a year and a half ago. She had two episodes which occurred very close together, first standing up in church singling, and the second was at a church dinner, then approximately three months ago a syncopal episode while she was "taking a hot shower".  She did experience fecal incontinence at that time.  After the episode she experienced severe abdominal pain, ended up having a bowel movement, and felt better. The patient experienced a syncopal episode on Thursday evening. She had mowed the lawn that day. She did not seek medical attention at that time. Friday she felt very weak. No associated nausea or vomiting. No abdominal pain. History of chronic constipation for at least two years prior to about three weeks ago when she stopped all of her medications at home except an aspirin 81 mg a day. She only restarted her home medications two days prior to presenting to the hospital. Variable bowel pattern habit again up until three weeks ago, would need to disimpact herself. No evidence of rectal bleeding. No melena. Last bowel movement was yesterday. No reflux. No dysphagia, weight is stable. Appetite is good.    History of a colonoscopy by Dr. Nicolasa Ducking 12/31/2003. Findings of multiple largemouth diverticula found in the sigmoid colon, descending colon, and ascending colon. The rectum, splenic flexure, transverse colon, hepatic flexure, and cecum were normal. Biopsies were taken with cold forceps for histology. Nonbleeding internal hemorrhoids noted. Impression: Diverticulosis. No history of EGD in the past.    Significant for shortness of breath for the past several months. Noted worse with exertion such as climbing stairs or going down stairs. She said she has not experienced any shortness of breath such as with mowing her lawn on Thursday. No chest pain.   PAST MEDICAL HISTORY: Hypertension, hyperlipidemia, atrial fibrillation followed by Dr. Bartholome Bill, recurrent syncopal episodes, hypothyroidism, questionable mitral valve prolapse, gastric ulcers, osteoporosis, history of diverticulosis and internal hemorrhoids.   PAST SURGICAL HISTORY:  Tonsillectomy, breast lumpectomy twice,  mastoiditis in the early 1980s, appendectomy, colonoscopy 12/31/2003.  FAMILY HISTORY: Father with history of CLL, pneumonia, deceased from septicemia. Mother deceased at the age of 17 with congestive heart failure. No colon cancer. Mother history of colonic polyps.   SOCIAL HISTORY: No tobacco. No alcohol use.    REVIEW OF SYSTEMS: All 10 systems reviewed and checked and otherwise unremarkable other than what is stated above.   PHYSICAL EXAMINATION:  VITAL SIGNS: Temperature 98.6, pulse 86, respirations 16, blood pressure 166/78, pulse oximetry 96% on room air.   GENERAL: Well developed, well nourished 81 year old Caucasian female. No acute distress noted. Resting comfortably in bed. Son present at bedside.   HEENT: Normocephalic, atraumatic. Pupils equal, reactive to light. Conjunctivae clear. Sclerae anicteric. Conjunctivae pale.  NECK: Supple. Trachea midline. No lymphadenopathy  or thyromegaly.   PULMONARY:  Symmetric rise and fall of chest. Clear to auscultation throughout.   CARDIOVASCULAR: S1, S2. No murmurs, no gallops.   ABDOMEN: Soft, nondistended. Bowel sounds in four quadrants. No bruits. No masses.   RECTAL: Deferred.   MUSCULOSKELETAL: Moving all four extremities. No contractures. No clubbing.   EXTREMITIES: No edema. Color pale, warm, dry. No lesions. No rashes.   NEUROLOGICAL: No gross neurological deficits.   LABORATORY, DIAGNOSTIC, AND RADIOLOGICAL DATA: Chemistry panel on admission 05/10: Glucose was elevated at 136, BUN was 39 with a creatinine of 1.97, creatinine has been corrected today's date 05/11 to 1.29. EGFR on admission 24, today improved to 39. TIBC was 598. Iron saturation 3. LDL is 106. Otherwise calcium, magnesium, total cholesterol, triglycerides within normal limits.  HDL elevated at 273, with a ferritin of 5. Hepatic panel; AST elevated at 53, today 34. Otherwise hepatic panel was within normal limits on admission. Cardiac enzymes: First in series total CK 74, today the second is 58, CK-MB originally 2.3, 1.8. Troponin has remained less than 0.02. TSH is 6.08. CBC on admission: WBC count 11.8, RBC 3.56, hemoglobin 7.4 with hematocrit 24.3, platelet count 443,000. MCV 68, MCH 20.9, MCHC is 30.7, RDW 17.9. The patient has received one unit of packed red blood cells and is receiving second unit at the time of interviewing process. Today hemoglobin is 7.7, hematocrit 24.4. Retic count on admission 1.84 with an absolute retic 0.0658. Antibody screen is positive. ABO group is plus Rh type is O+.  Urinalysis:  +2 leukocytes, WBCs 55 per high-power field, bacteria none,  hyaline casts 22 per high-power field.   IMPRESSION:  Iron deficiency anemia, symptomatic with associated syncopal episodes and known history of atrial fibrillation, hypertension, hypothyroidism as well as gastric ulcers diagnosed in 1983.  Personal history of colonic polyps as well as family history of colonic  polyps found in first-degree relative, mother.   PLAN: The patient's presentation to be discussed with Dr. Gaylyn Cheers.  Addendum to follow with future care recommendations.    These services provided by Ebony Cargo, NP in collaborative agreement with Dr. Gaylyn Cheers. ____________________________ Payton Emerald, NP dsh:bjt D:  02/08/2012 11:46:09 ET          T: 02/09/2012 11:33:41 ET          JOB#: [id number]  cc: Payton Emerald, NP, <Dictator> Payton Emerald MD ELECTRONICALLY SIGNED 02/13/2012 12:25

## 2015-01-22 NOTE — Consult Note (Signed)
Patient with small-medium colon cancer in mid proximal ascending colon on colonoscopy, firm hard mass, biopsied 5 times.   Will need resection.  Has significant diverticulosis throughout most of the colon.  Electronic Signatures: Scot JunElliott, Caidence Kaseman T (MD)  (Signed on 14-May-13 12:40)  Authored  Last Updated: 14-May-13 12:40 by Scot JunElliott, Daveigh Batty T (MD)

## 2015-01-22 NOTE — Consult Note (Signed)
Pt with surgery today, doing well.  I will sign off, reconsult if needed.  Recommend all first degree relatives have appropriate screening colonoscopys.  Electronic Signatures: Scot JunElliott, Robert T (MD)  (Signed on 15-May-13 16:04)  Authored  Last Updated: 15-May-13 16:04 by Scot JunElliott, Robert T (MD)

## 2015-01-22 NOTE — H&P (Signed)
PATIENT NAME:  Grace English, Grace English MR#:  [id number] DATE OF BIRTH:  [birth year]  DATE OF ADMISSION:  02/07/2012  REFERRING PHYSICIAN: Dr. Clemens English PRIMARY CARE PHYSICIAN: Dr. Dareen English  PRIMARY CARDIOLOGIST: Dr. Lady English  PRESENTING COMPLAINT: Syncope and collapse.   HISTORY OF PRESENT ILLNESS: Ms. Grace English is a pleasant 81 year old woman with a history of vitamin D deficiency, osteoporosis, hyperlipidemia, hypertension, hypothyroidism, history of gastric ulcers, prior history of syncope, and transient atrial fibrillation who presents today with reports of developing a syncopal spell two nights ago. The patient reports her initial episode back in May 2011 when she was at church. She had a complete work-up at that time which was negative. She was found to have a transient atrial fibrillation but has not had any recurrent episodes. Her 24-hour monitor showed sinus rhythm with PVCs and PACs according to Dr. America English's notes, and an echocardiogram in April 2011 showed ejection fraction was normal with insignificant valvular abnormalities. The patient reports for the past four weeks now she has been craving ice chips. She reports that one month ago she had a syncopal spell times one after coming out of a hot shower. Her daughter-in-law, who is a Engineer, civil (consulting)nurse, took her blood pressure and it was low at that time. She reports a similar episode two nights ago where she was coming out of the shower and she had a passing out spell lasting for a few seconds. She reports a small amount of bowel incontinence. She reports that she developed dizziness approximately two nights ago as well and since then has been having some lightheadedness and dizziness intermittently, but has still been functional. She was actually mowing the lawn yesterday and was pretty active doing her errands. She reports dizziness with getting up stairs and getting up from a sitting position.  She took her blood pressure this morning and found that her blood pressure was  low, 90/40. Therefore, the patient presented for evaluation. She reports that three weeks ago she ran out of all of her blood pressure medications and reinitiated yesterday. She denies any chest pain or shortness of breath. No palpitations.   PAST MEDICAL HISTORY:  1. Evaluated for syncope and transient atrial fibrillation in May 2011 with work-up negative.  2. Vitamin D deficiency.  3. Osteoporosis.  4. Hyperlipidemia.  5. Hypertension.  6. Hypothyroidism.  7. Mitral valve prolapse.  8. History of gastric ulcers.  9. Colonoscopy in April 2005 revealing for diverticulosis and internal hemorrhoids.   PAST SURGICAL HISTORY:  1. Bilateral cataract surgery.  2. Appendectomy.  3. Tonsillectomy.  4. Breast biopsy times two. 5. Dilation and curettage.   ALLERGIES: Darvon, penicillin, Actonel, and tetanus.   MEDICATIONS:  1. Synthroid 50 mcg daily.  2. Verapamil XR 360 mg daily.  3. Vitamin D 1000 units daily.  4. Hyzaar 100/25 mg 1 tablet daily.  5. Lovastatin 20 mg daily.  6. Niacin 500 mg daily.  7. Aspirin 81 mg daily.   FAMILY HISTORY: Mother with heart failure. She is deceased. Father deceased from sepsis. He had history of chronic leukemia. Also family history of coronary artery disease.   SOCIAL HISTORY: She lives in [place] with her husband. She used to smoke when she was in high school and college. She seldom drinks alcohol. No drug use.     REVIEW OF SYSTEMS: CONSTITUTIONAL: No fevers, nausea, or vomiting. EYES: No glaucoma. She has a history of cataracts. ENT: No epistaxis or discharge. RESPIRATORY: No cough, wheezing, or hemoptysis. CARDIOVASCULAR: No chest pain, orthopnea, edema,  or palpitations. Endorses syncope times two since last month as per history of present illness. GI: No nausea, vomiting, diarrhea, or abdominal pain. Denies any hematemesis or melena. No bright red blood per rectum. GU: No dysuria or hematuria. ENDO: No polyuria or polydipsia. HEME: No easy  bleeding. SKIN: No ulcers. MUSCULOSKELETAL: No neck pain or joint pain. NEURO: No one-sided weakness or numbness. PSYCH: Denies any suicidal ideation.   PHYSICAL EXAMINATION:  VITAL SIGNS: Temperature 96.4, pulse 71, respiratory rate 20, blood pressure of 140/65 with a pulse of 71 on lying, 109/55 with pulse 69 on sitting, and 112/157 with pulse of 68 on standing.   GENERAL: Lying in bed in no apparent distress.   HEENT: Normocephalic, atraumatic. Pupils equal, symmetric, anicteric. Nares without discharge. Moist mucous membranes.   NECK: Soft and supple. No adenopathy or JVP.   CARDIOVASCULAR: Non-tachy. She has a soft systolic murmur. No rubs or gallops.   LUNGS: Clear to auscultation bilaterally. No use of accessory muscles or increased respiratory effort.   ABDOMEN: Soft. Positive bowel sounds. No mass appreciated.   EXTREMITIES: No edema. Dorsal pedis pulses intact.   MUSCULOSKELETAL: No joint effusion.   SKIN: No ulcers.   NEUROLOGIC: No dysarthria or aphasia. Symmetrical strength. No focal deficits.   PSYCH: She is alert and oriented. The patient is cooperative.   LABORATORY, DIAGNOSTIC AND RADIOLOGICAL DATA: TIBC 598, unbound iron binding capacity of 582, serum iron 16%, saturation 3, LDH of 273, ferritin 5, retic count 1.84, glucose 136, BUN 39, creatinine 1.97, sodium 138, potassium 3.9, chloride 105, carbon dioxide 23, calcium 9.4, total bilirubin 0.4, alkaline phosphatase 85, ALT 42, AST 53, total protein 7.9, albumin 3.8, WBC 11.8, hemoglobin 7.4, hematocrit 24.3, platelets 443, MCV of 68. Troponin less than 0.02. CK 74, MB 2.3. EKG with sinus rate of 69. No ST elevation or depression. There is T wave inversion in V1 and aVR.   ASSESSMENT AND PLAN: Mrs. Grace English is a 81 year old woman with history of hypertension, hyperlipidemia, transient atrial fibrillation, syncope, hypothyroidism, and gastric ulcer presenting status post syncope and collapse.  1. Syncope and collapse  with initial episode back in May 2011 as above: Her recurrent syncopal episodes are likely multifactorial in the setting of anemia, dehydration, and orthostatic hypotension with vasodilation post hot shower. We will hold her blood pressure medications. Continue to do orthostatic vital signs.  Continue telemetry.  Cycle cardiac enzymes.  Her last echo was in April 2011. Hold off on additional work-up for now as there is a clear etiology for her syncopal spells. We will continue with IV fluid repletion.  2. Iron deficiency anemia: GI consultation. We will transfuse 2 units for now. Start on b.i.d. PPI. We will hold her aspirin.  3. Acute renal failure: Hold Hyzaar. Continue IV fluids. Continue to follow ins and outs and daily creatinine.  4. Hyperglycemia: Send A1c.  5. Hypothyroidism: Send TSH.  6. Prophylaxis with TEDs, SCDs and Protonix.   TIME SPENT: Approximately 50 minutes were spent on patient care.     ____________________________ Reuel Derby, MD ap:bjt D: 02/08/2012 00:12:08 ET T: 02/08/2012 09:56:40 ET JOB#: [id number]  cc: Pearlean Brownie Mata Rowen, MD, <Dictator> Marya Amsler. Grace Piano, MD Reuel Derby MD ELECTRONICALLY SIGNED 02/26/2012 23:02

## 2015-01-22 NOTE — Op Note (Signed)
PATIENT NAME:  Grace SimmerSTRANGE, Audry E MR#:  [id number] DATE OF BIRTH:  Jul 21, 1930  DATE OF PROCEDURE:  02/12/2012  PREOPERATIVE DIAGNOSIS: Right colon carcinoma.   POSTOPERATIVE DIAGNOSIS: Right colon carcinoma.   OPERATION: Laparoscopically assisted right colectomy.   ANESTHESIA: General.   SURGEON: Quentin Orealph L. Ely, III, MD   OPERATIVE PROCEDURE: With the patient in the supine position after induction of appropriate general anesthesia, the patient's abdomen was prepped with ChloraPrep and draped with sterile towels. A small infraumbilical incision was made in the standard fashion, carried down bluntly through the subcutaneous tissue. Veress needle was used to cannulate the peritoneal cavity. CO2 was insufflated to appropriate pressure measurements. When approximately 2.5 liters of CO2 were instilled, the Veress needle was withdrawn. An 11 mm [employer] medical port was inserted into the peritoneal cavity. Intraperitoneal position was confirmed and CO2 was reinsufflated. The patient was placed in the head up position and rotated slightly to the left side. A right upper quadrant transverse incision was made and an 11 mm port was inserted under direct vision. The colon was visualized on the right side. The abdomen was examined and no other significant abnormalities were identified. There did not appear to be any evidence of injury. Multiple adhesions to the right colon and cecum from her previous appendectomy. Right lower quadrant transverse incision was made and an 11 mm port was inserted under direct vision. The camera was moved to the upper port and dissection carried out through the two lower ports. The right colon was mobilized with difficulty using the LigaSure apparatus from the distal ileum to the mid transverse colon. The abdomen was then desufflated. Midline incision was extended above the umbilicus, carried down through the subcutaneous tissue with Bovie electrocautery. Midline fascia was identified and  opened the length of the skin incision as was the peritoneum. There did appear to be a small infraumbilical hernia. The right colon was then mobilized into the incision from the distal ileum to the mid transverse colon. The area was chosen on the proximal transverse colon and on the distal ileum for the resection. The bowel was divided with the GIA-55 stapling device. The LigaSure was used to take down the mesentery in a V-shaped fashion. Specimen was opened and did contain the identified lesion. Two loops of bowel were placed side by side. A small enterotomy was made in each loop of bowel. The GIA-55 stapler was used to create a side-to-side or functional end-to-end anastomosis. The enterotomy was closed with single application of TX 60 device carrying a blue load. The suture line was reinforced with 3-0 silk and the mesentery defect repaired with 3-0 Vicryl. Bowel contents were returned to their anatomic position. The midline fascia was closed with running suture of 0 Prolene from each end tying in the middle. The abdomen was then reinsufflated and the repair and resection evaluated. No significant bleeding was encountered. There did not appear to be any evidence of kink or twist. The remainder of the abdomen seemed unremarkable. The abdomen was then desufflated and the skin clipped. Areas were infiltrated with 0.25% Marcaine for postoperative pain control. Sterile dressings were applied.   ____________________________ Carmie Endalph L. Ely III, MD rle:drc D: 02/12/2012 13:45:43 ET T: 02/12/2012 14:08:42 ET JOB#: [id number]  cc: Carmie Endalph L. Ely III, MD, <Dictator> Scot Junobert T. Elliott, MD Marya AmslerMarshall W. Dareen PianoAnderson, MD Quentin OreALPH L ELY MD ELECTRONICALLY SIGNED 02/12/2012 16:40
